# Patient Record
Sex: Female | Born: 1995 | Race: White | Hispanic: No | Marital: Single | Smoking: Former smoker
Health system: Southern US, Community
[De-identification: ages and names within clinical notes are randomized; demographics above are authoritative.]

## PROBLEM LIST (undated history)

## (undated) DIAGNOSIS — B009 Herpesviral infection, unspecified: Secondary | ICD-10-CM

## (undated) DIAGNOSIS — B977 Papillomavirus as the cause of diseases classified elsewhere: Secondary | ICD-10-CM

## (undated) DIAGNOSIS — D682 Hereditary deficiency of other clotting factors: Secondary | ICD-10-CM

## (undated) DIAGNOSIS — Z86718 Personal history of other venous thrombosis and embolism: Secondary | ICD-10-CM

## (undated) HISTORY — PX: ADENOIDECTOMY: SHX5191

## (undated) HISTORY — PX: SINUSOTOMY: SHX291

## (undated) HISTORY — DX: Hereditary deficiency of other clotting factors: D68.2

## (undated) HISTORY — PX: WISDOM TOOTH EXTRACTION: SHX21

## (undated) HISTORY — PX: MYRINGOTOMY: SUR874

---

## 2015-08-11 DIAGNOSIS — Z8619 Personal history of other infectious and parasitic diseases: Secondary | ICD-10-CM | POA: Insufficient documentation

## 2015-08-11 DIAGNOSIS — L680 Hirsutism: Secondary | ICD-10-CM | POA: Insufficient documentation

## 2017-02-01 DIAGNOSIS — R87810 Cervical high risk human papillomavirus (HPV) DNA test positive: Secondary | ICD-10-CM

## 2017-02-01 DIAGNOSIS — R87610 Atypical squamous cells of undetermined significance on cytologic smear of cervix (ASC-US): Secondary | ICD-10-CM | POA: Insufficient documentation

## 2017-07-20 DIAGNOSIS — F319 Bipolar disorder, unspecified: Secondary | ICD-10-CM | POA: Insufficient documentation

## 2017-09-16 DIAGNOSIS — I2699 Other pulmonary embolism without acute cor pulmonale: Secondary | ICD-10-CM | POA: Insufficient documentation

## 2017-10-12 DIAGNOSIS — Z832 Family history of diseases of the blood and blood-forming organs and certain disorders involving the immune mechanism: Secondary | ICD-10-CM | POA: Diagnosis not present

## 2017-10-12 DIAGNOSIS — I2699 Other pulmonary embolism without acute cor pulmonale: Secondary | ICD-10-CM | POA: Diagnosis not present

## 2017-10-12 DIAGNOSIS — Z7901 Long term (current) use of anticoagulants: Secondary | ICD-10-CM | POA: Diagnosis not present

## 2017-12-26 DIAGNOSIS — I2699 Other pulmonary embolism without acute cor pulmonale: Secondary | ICD-10-CM

## 2017-12-26 DIAGNOSIS — Z832 Family history of diseases of the blood and blood-forming organs and certain disorders involving the immune mechanism: Secondary | ICD-10-CM | POA: Diagnosis not present

## 2017-12-26 DIAGNOSIS — D6852 Prothrombin gene mutation: Secondary | ICD-10-CM

## 2017-12-26 DIAGNOSIS — Z7901 Long term (current) use of anticoagulants: Secondary | ICD-10-CM | POA: Diagnosis not present

## 2018-09-20 ENCOUNTER — Encounter (HOSPITAL_COMMUNITY): Payer: Self-pay | Admitting: *Deleted

## 2018-09-20 ENCOUNTER — Other Ambulatory Visit: Payer: Self-pay

## 2018-09-20 ENCOUNTER — Inpatient Hospital Stay (HOSPITAL_COMMUNITY)
Admission: AD | Admit: 2018-09-20 | Discharge: 2018-09-20 | Disposition: A | Discharge status: Home / Self Care | Payer: Medicaid Other | Attending: Family Medicine | Admitting: Family Medicine

## 2018-09-20 ENCOUNTER — Inpatient Hospital Stay (HOSPITAL_BASED_OUTPATIENT_CLINIC_OR_DEPARTMENT_OTHER): Payer: Medicaid Other

## 2018-09-20 DIAGNOSIS — Z6730 Type AB blood, Rh positive: Secondary | ICD-10-CM | POA: Insufficient documentation

## 2018-09-20 DIAGNOSIS — Z87891 Personal history of nicotine dependence: Secondary | ICD-10-CM | POA: Diagnosis not present

## 2018-09-20 DIAGNOSIS — O209 Hemorrhage in early pregnancy, unspecified: Secondary | ICD-10-CM | POA: Insufficient documentation

## 2018-09-20 DIAGNOSIS — Z87898 Personal history of other specified conditions: Secondary | ICD-10-CM | POA: Insufficient documentation

## 2018-09-20 DIAGNOSIS — Z86711 Personal history of pulmonary embolism: Secondary | ICD-10-CM | POA: Insufficient documentation

## 2018-09-20 DIAGNOSIS — F319 Bipolar disorder, unspecified: Secondary | ICD-10-CM | POA: Diagnosis not present

## 2018-09-20 DIAGNOSIS — O4692 Antepartum hemorrhage, unspecified, second trimester: Secondary | ICD-10-CM

## 2018-09-20 DIAGNOSIS — Z3A15 15 weeks gestation of pregnancy: Secondary | ICD-10-CM | POA: Diagnosis not present

## 2018-09-20 DIAGNOSIS — O2692 Pregnancy related conditions, unspecified, second trimester: Secondary | ICD-10-CM | POA: Diagnosis not present

## 2018-09-20 DIAGNOSIS — O99342 Other mental disorders complicating pregnancy, second trimester: Secondary | ICD-10-CM | POA: Diagnosis not present

## 2018-09-20 DIAGNOSIS — F1991 Other psychoactive substance use, unspecified, in remission: Secondary | ICD-10-CM | POA: Insufficient documentation

## 2018-09-20 DIAGNOSIS — Z7901 Long term (current) use of anticoagulants: Secondary | ICD-10-CM | POA: Insufficient documentation

## 2018-09-20 HISTORY — DX: Herpesviral infection, unspecified: B00.9

## 2018-09-20 HISTORY — DX: Personal history of other venous thrombosis and embolism: Z86.718

## 2018-09-20 HISTORY — DX: Papillomavirus as the cause of diseases classified elsewhere: B97.7

## 2018-09-20 LAB — URINALYSIS, ROUTINE W REFLEX MICROSCOPIC
Bacteria, UA: NONE SEEN
Bilirubin Urine: NEGATIVE
Glucose, UA: NEGATIVE mg/dL
Ketones, ur: NEGATIVE mg/dL
Leukocytes,Ua: NEGATIVE
Nitrite: NEGATIVE
Protein, ur: NEGATIVE mg/dL
Specific Gravity, Urine: 1.003 — ABNORMAL LOW (ref 1.005–1.030)
pH: 8 (ref 5.0–8.0)

## 2018-09-20 NOTE — MAU Note (Signed)
PT SAYS LMP WAS 06-07-2018- PREG CONFIRMED  IN [facility] IN [place]- PLANS  TO TRANSFER  TO [facility] [place]. . EDC- 03-14-2019.  TONIGHT  AT 0030- ROLLED OVER TO SIDE -HAD PAIN-  THEN HAD SEX- THEN HAD  BASKETBALL SIZE   OF BLOOD ON BED-    PT IS ON BLOOD THINNERS.   (PT ON PHONE )    PAD ON IN TRIAGE - NOTHING .  FELT CRAMPS AT HOME- - NOW CRAMPS  SAME AS HOME.   Marland Kitchen

## 2018-09-20 NOTE — MAU Provider Note (Signed)
Chief Complaint:  Vaginal Bleeding   First Provider Initiated Contact with Patient 09/20/18 0328      HPI: Shannon Faulkner is a 23 y.o. G2P1001 at 51w0dwho presents to maternity admissions reporting heavy vaginal bleeding after intercourse with mild cramping.  Both of which have now resolved.  . She reports good fetal movement, denies LOF, vaginal bleeding, vaginal itching/burning, urinary symptoms, h/a, dizziness, n/v, diarrhea, constipation or fever/chills.  Blood type AB+  RN Note: PT SAYS LMP WAS 06-07-2018- PREG CONFIRMED  IN [facility] IN [place]- PLANS  TO TRANSFER  TO [facility] [place]. . EDC- 03-14-2019.  TONIGHT  AT 0030- ROLLED OVER TO SIDE -HAD PAIN-  THEN HAD SEX- THEN HAD  BASKETBALL SIZE   OF BLOOD ON BED-    PT IS ON BLOOD THINNERS.   (PT ON PHONE )    PAD ON IN TRIAGE - NOTHING .  FELT CRAMPS AT HOME- - NOW CRAMPS  SAME AS HOME.   Patient Active Problem List   Diagnosis Date Noted  . History of pulmonary embolism 09/20/2018  . History of drug use 09/20/2018  . Pulmonary embolism and infarction (HCC) 09/16/2017  . Bipolar 1 disorder (HCC) 07/20/2017  . ASCUS with positive high risk HPV cervical 02/01/2017  . 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 H/O chlamydia infection 08/11/2015     Past Medical History: Past Medical History:  Diagnosis Date  . H/O blood clots   . HPV in female   . HSV infection     Past obstetric history: OB History  Gravida Para Term Preterm AB Living  2 1 1     1   SAB TAB Ectopic Multiple Live Births          1    # Outcome Date GA Lbr Len/2nd Weight Sex Delivery Anes PTL Lv  2 Current           1 Term      Vag-Spont       Past Surgical History: noncontributory  Family History: History reviewed. No pertinent family history.  Social History: Social History   Tobacco Use  . Smoking status: Former Games developer  . Smokeless tobacco: Former Engineer, water Use Topics  . Alcohol use: Not on file    Comment: NOT WHILE PREG  . Drug use: Never     Allergies: No Known Allergies  Meds:  Medications Prior to Admission  Medication Sig Dispense Refill Last Dose  . enoxaparin (LOVENOX) 30 MG/0.3ML injection Inject 30 mg into the skin every 12 (twelve) hours.   09/19/2018 at Unknown time  . Prenatal Vit-Fe Fumarate-FA (PRENATAL MULTIVITAMIN) TABS tablet Take 1 tablet by mouth daily at 12 noon.   09/19/2018 at Unknown time    I have reviewed patient's Past Medical Hx, Surgical Hx, Family Hx, Social Hx, medications and allergies.   ROS:  Review of Systems  Constitutional: Negative for chills and fever.  Gastrointestinal: Negative for constipation, diarrhea and nausea.  Genitourinary: Positive for pelvic pain (some cramping, now resolved) and vaginal bleeding.  Neurological: Negative for weakness.   Other systems negative  Physical Exam   Patient Vitals for the past 24 hrs:  BP Temp Temp src Pulse Resp Height Weight  09/20/18 0245 106/69 98.2 F (36.8 C) Oral (!) 104 20 5\' 2"  (1.575 m) 62.8 kg   Constitutional: Well-developed, well-nourished female in no acute distress.  Cardiovascular: normal rate and rhythm Respiratory: normal effort, clear to auscultation bilaterally GI: Abd soft, non-tender, gravid appropriate for gestational age.  No rebound or guarding. MS: Extremities nontender, no edema, normal ROM Neurologic: Alert and oriented x 4.  GU: Neg CVAT.  PELVIC EXAM: Cervix pink, visually closed, without lesion, scant red mucousy discharge, vaginal walls and external genitalia normal  Cervix closed and long  FHT:  155   Labs: Results for orders placed or performed during the hospital encounter of 09/20/18 (from the past 24 hour(s))  Urinalysis, Routine w reflex microscopic     Status: Abnormal   Collection Time: 09/20/18  3:14 AM  Result Value Ref Range   Color, Urine COLORLESS (A) YELLOW   APPearance CLEAR CLEAR   Specific Gravity, Urine 1.003 (L) 1.005 - 1.030   pH 8.0 5.0 - 8.0   Glucose, UA NEGATIVE NEGATIVE  mg/dL   Hgb urine dipstick SMALL (A) NEGATIVE   Bilirubin Urine NEGATIVE NEGATIVE   Ketones, ur NEGATIVE NEGATIVE mg/dL   Protein, ur NEGATIVE NEGATIVE mg/dL   Nitrite NEGATIVE NEGATIVE   Leukocytes,Ua NEGATIVE NEGATIVE   RBC / HPF 0-5 0 - 5 RBC/hpf   WBC, UA 0-5 0 - 5 WBC/hpf   Bacteria, UA NONE SEEN NONE SEEN   Squamous Epithelial / LPF 0-5 0 - 5     Blood Type AB+ from prenatals  Imaging:    MAU Course/MDM: I have ordered labs and reviewed results. Urine clear, Blood type AB+ US ordered to rule out previa, none seen (per US tech)  Prev [facility] no longer seen Discussed post coital bleeding could be related to friable cervix [facility] Recommend pelvic rest until 1-2 weeks with no bleeding    Assessment: 1. Antepartum bleeding, second trimester   2.     On Lovenox for hx PE/Infarct 3.     Blood type AB+  Plan: Discharge home Bleeding precautions  Follow up in Office for prenatal visits and recheck of status Pelvic rest Encouraged to return here or to other Urgent Care/ED if she develops worsening of symptoms, increase in pain, fever, or other concerning symptoms.  Pt stable at time of discharge.  Wynelle BourgeoisMarie Hayle Parisi CNM, MSN Certified Nurse-Midwife 09/20/2018 3:28 AM

## 2018-09-20 NOTE — Discharge Instructions (Signed)
Activity Restriction During Pregnancy °Your health care provider may recommend specific activity restrictions during pregnancy for a variety of reasons. Activity restriction may require that you limit activities that require great effort, such as exercise, lifting, or sex. °The type of activity restriction will vary for each person, depending on your risk or the problems you are having. Activity restriction may be recommended for a period of time until your baby is delivered. °Why are activity restrictions recommended? °Activity restriction may be recommended if: °· Your placenta is partially or completely covering the opening of your cervix (placenta previa). °· There is bleeding between the wall of the uterus and the amniotic sac in the first trimester of pregnancy (subchorionic hemorrhage). °· You went into labor too early (preterm labor). °· You have a history of miscarriage. °· You have a condition that causes high blood pressure during pregnancy (preeclampsia or eclampsia). °· You are pregnant with more than one baby. °· Your baby is not growing well. °What are the risks? °The risks depend on your specific restriction. Strict bed rest has the most physical and emotional risks and is no longer routinely recommended. Risks of strict bed rest include: °· Loss of muscle conditioning from not moving. °· Blood clots. °· Social isolation. °· Depression. °· Loss of income. °Talk with your health care team about activity restriction to decide if it is best for you and your baby. Even if you are having problems during your pregnancy, you may be able to continue with normal levels of activity with careful monitoring by your health care team. °Follow these instructions at home: °If needed, based on your overall health and the health of your baby, your health care provider will decide which type of activity restriction is right for you. Activity restrictions may include: °· Not lifting anything heavier than 10 pounds (4.5  kg). °· Avoiding activities that take a lot of physical effort. °· No lifting or straining. °· Resting in a sitting position or lying down for periods of time during the day. °Pelvic rest may be recommended along with activity restrictions. If pelvic rest is recommended, then: °· Do not have sex, an orgasm, or use sexual stimulation. °· Do not use tampons. Do not douche. Do not put anything into your vagina. °· Do not lift anything that is heavier than 10 lb (4.5 kg). °· Avoid activities that require a lot of effort. °· Avoid any activity in which your pelvic muscles could become strained, such as squatting. °Questions to ask your health care provider °· Why is my activity being limited? °· How will activity restrictions affect my body? °· Why is rest helpful for me and my baby? °· What activities can I do? °· When can I return to normal activities? °When should I seek immediate medical care? °Seek immediate medical care if you have: °· Vaginal bleeding. °· Vaginal discharge. °· Cramping pain in your lower abdomen. °· Regular contractions. °· A low, dull backache. °Summary °· Your health care provider may recommend specific activity restrictions during pregnancy for a variety of reasons. °· Activity restriction may require that you limit activities such as exercise, lifting, sex, or any other activity that requires great effort. °· Discuss the risks and benefits of activity restriction with your health care team to decide if it is best for you and your baby. °· Contact your health care provider right away if you think you are having contractions, or if you notice vaginal bleeding, discharge, or cramping. °This information is not   intended to replace advice given to you by your health care provider. Make sure you discuss any questions you have with your health care provider. Document Released: 09/11/2010 Document Revised: 09/06/2017 Document Reviewed: 09/06/2017 Elsevier Interactive Patient Education  2019 Elsevier  Inc. Vaginal Bleeding During Pregnancy, Second Trimester  A small amount of bleeding (spotting) from the vagina is relatively common during pregnancy. It usually stops on its own. Various things can cause spotting during pregnancy. Sometimes the bleeding is normal and is not a sign of a problem in the pregnancy. However, bleeding can also be a sign of something serious. Be sure to tell your health care provider about any vaginal bleeding right away. Some possible causes of vaginal bleeding during the second trimester include:  Infection, inflammation, or growths (polyps) on the cervix.  A condition in which the placenta partially or completely covers the opening of the cervix inside the uterus (placenta previa).  The placenta separating from the uterus (placenta abruption).  Early (preterm) labor.  The cervix opening and thinning before pregnancy is at term and before labor starts (cervical insufficiency).  A mass of tissue developing in the uterus due to an egg being fertilized incorrectly (molar pregnancy). Follow these instructions at home: Activity  Follow instructions from your health care provider about limiting your activity. Ask what activities are safe for you.  If needed, make plans for someone to help with your regular activities.  Do not exercise or do activities that take a lot of effort unless your health care provider approves.  Do not lift anything that is heavier than 10 lb (4.5 kg), or the limit that your health care provider tells you, until he or she says that it is safe.  Do not have sex or orgasms until your health care provider says that this is safe. Medicines  Take over-the-counter and prescription medicines only as told by your health care provider.  Do not take aspirin because it can cause bleeding. General instructions  Pay attention to any changes in your symptoms.  Write down how many pads you use each day, how often you change pads, and how soaked  (saturated) they are.  Do not use tampons or douche.  If you pass any tissue from your vagina, save the tissue so you can show it to your health care provider.  Keep all follow-up visits as told by your health care provider. This is important. Contact a health care provider if:  You have vaginal bleeding during any time of your pregnancy.  You have cramps or labor pains.  You have a fever that does not get better when you take medicines. Get help right away if:  You have severe cramps in your back or abdomen.  You have contractions.  You have chills.  You pass large clots or a large amount of tissue from your vagina.  Your bleeding increases.  You feel light-headed or weak, or you faint.  You are leaking fluid or have a gush of fluid from your vagina. Summary  Various things can cause bleeding or spotting in pregnancy.  Be sure to tell your health care provider about any vaginal bleeding right away.  Follow instructions from your health care provider about limiting your activity. Ask what activities are safe for you. This information is not intended to replace advice given to you by your health care provider. Make sure you discuss any questions you have with your health care provider. Document Released: 02/24/2005 Document Revised: 08/19/2016 Document Reviewed: 08/19/2016 Elsevier  Interactive Patient Education  2019 Elsevier Inc.  [facility] Providers    [facility] [employer] [facility] at [facility]       Phone: [phone]  [facility] for [employer] at [place]   Phone: [phone]  [facility] [employer] at [place]  Phone: [phone]  [facility] for [facility] at [facility]  Phone: [phone]  [facility] for [facility] at [place]  Phone: [phone]  [facility] at [facility]   Phone: [phone]  [facility]       Phone: [phone]  [facility]       Phone: [phone]   [facility]    Phone: [phone]  [facility]    Phone: [phone]  [facility]    Phone: [phone]  [facility]       Phone: [phone]   [facility]  Phone: [phone]  [facility]    Phone: [phone]  [facility]   Phone: [phone]  [facility]      Phone: [phone]  [facility]    Phone: [phone]

## 2020-05-25 ENCOUNTER — Other Ambulatory Visit: Payer: Self-pay

## 2020-05-25 ENCOUNTER — Ambulatory Visit (HOSPITAL_COMMUNITY)
Admission: EM | Admit: 2020-05-25 | Discharge: 2020-05-26 | Disposition: A | Discharge status: Home / Self Care | Payer: Medicaid Other | Attending: Behavioral Health | Admitting: Behavioral Health

## 2020-05-25 ENCOUNTER — Ambulatory Visit (HOSPITAL_COMMUNITY)
Admission: AD | Admit: 2020-05-25 | Discharge: 2020-05-25 | Disposition: A | Discharge status: Home / Self Care | Payer: Medicaid Other | Attending: Psychiatry | Admitting: Psychiatry

## 2020-05-25 ENCOUNTER — Encounter (HOSPITAL_COMMUNITY): Payer: Self-pay | Admitting: Emergency Medicine

## 2020-05-25 DIAGNOSIS — F909 Attention-deficit hyperactivity disorder, unspecified type: Secondary | ICD-10-CM | POA: Insufficient documentation

## 2020-05-25 DIAGNOSIS — R4587 Impulsiveness: Secondary | ICD-10-CM | POA: Insufficient documentation

## 2020-05-25 DIAGNOSIS — R454 Irritability and anger: Secondary | ICD-10-CM | POA: Insufficient documentation

## 2020-05-25 DIAGNOSIS — G4700 Insomnia, unspecified: Secondary | ICD-10-CM | POA: Insufficient documentation

## 2020-05-25 DIAGNOSIS — F419 Anxiety disorder, unspecified: Secondary | ICD-10-CM | POA: Insufficient documentation

## 2020-05-25 DIAGNOSIS — Z20822 Contact with and (suspected) exposure to covid-19: Secondary | ICD-10-CM | POA: Insufficient documentation

## 2020-05-25 DIAGNOSIS — R450 Nervousness: Secondary | ICD-10-CM | POA: Insufficient documentation

## 2020-05-25 DIAGNOSIS — Z87891 Personal history of nicotine dependence: Secondary | ICD-10-CM | POA: Insufficient documentation

## 2020-05-25 DIAGNOSIS — Z79899 Other long term (current) drug therapy: Secondary | ICD-10-CM | POA: Insufficient documentation

## 2020-05-25 DIAGNOSIS — F332 Major depressive disorder, recurrent severe without psychotic features: Secondary | ICD-10-CM | POA: Insufficient documentation

## 2020-05-25 DIAGNOSIS — F6381 Intermittent explosive disorder: Secondary | ICD-10-CM

## 2020-05-25 LAB — POCT URINE DRUG SCREEN - MANUAL ENTRY (I-SCREEN)
POC Amphetamine UR: POSITIVE — AB
POC Cocaine UR: NOT DETECTED
POC Methadone UR: NOT DETECTED
POC Methamphetamine UR: NOT DETECTED
POC Morphine: NOT DETECTED
POC Oxazepam (BZO): NOT DETECTED
POC Oxycodone UR: NOT DETECTED
POC Secobarbital (BAR): NOT DETECTED

## 2020-05-25 LAB — POCT URINE DRUG SCREEN - MANUAL ENTRY (I-CUP)
POC Buprenorphine (BUP): NOT DETECTED
POC Marijuana UR: NOT DETECTED

## 2020-05-25 LAB — GLUCOSE, CAPILLARY: Glucose-Capillary: 87 mg/dL (ref 70–99)

## 2020-05-25 LAB — POC SARS CORONAVIRUS 2 AG -  ED: SARS Coronavirus 2 Ag: NEGATIVE

## 2020-05-25 LAB — POCT PREGNANCY, URINE: Preg Test, Ur: NEGATIVE

## 2020-05-25 LAB — POC SARS CORONAVIRUS 2 AG: SARS Coronavirus 2 Ag: NEGATIVE

## 2020-05-25 LAB — CBG MONITORING, ED

## 2020-05-25 MED ORDER — BUPROPION HCL ER (XL) 150 MG PO TB24
150.0000 mg | ORAL_TABLET | Freq: Two times a day (BID) | ORAL | Status: DC
  Administered 2020-05-25: 150 mg via ORAL
  Filled 2020-05-25: qty 1

## 2020-05-25 MED ORDER — MAGNESIUM HYDROXIDE 400 MG/5ML PO SUSP: 30.0000 mL | Freq: Every day | ORAL | Status: DC | PRN

## 2020-05-25 MED ORDER — HYDROXYZINE HCL 25 MG PO TABS
25.0000 mg | ORAL_TABLET | Freq: Once | ORAL | Status: AC
  Administered 2020-05-25: 25 mg via ORAL
  Filled 2020-05-25: qty 1

## 2020-05-25 MED ORDER — ACETAMINOPHEN 325 MG PO TABS: 650.0000 mg | ORAL_TABLET | Freq: Four times a day (QID) | ORAL | Status: DC | PRN

## 2020-05-25 MED ORDER — ALUM & MAG HYDROXIDE-SIMETH 200-200-20 MG/5ML PO SUSP: 30.0000 mL | ORAL | Status: DC | PRN

## 2020-05-25 NOTE — ED Notes (Signed)
Pt is sitting in bed eating@this  time. breathing even and unlabored. Will continue to monitor for safety
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
## 2020-05-25 NOTE — ED Notes (Signed)
Patient belongings placed in [place] [id number].

## 2020-05-25 NOTE — ED Triage Notes (Addendum)
Presents with complaint of anger issues at home.  Denies SI, HI or AVH.  Feeling hopeless at present. Complaint of depression and anger outbursts at home.
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
## 2020-05-26 LAB — LIPID PANEL
Cholesterol: 165 mg/dL (ref 0–200)
HDL: 66 mg/dL (ref >40)
LDL Cholesterol: 83 mg/dL (ref 0–99)
Total CHOL/HDL Ratio: 2.5 RATIO
Triglycerides: 81 mg/dL (ref <150)
VLDL: 16 mg/dL (ref 0–40)

## 2020-05-26 LAB — CBC WITH DIFFERENTIAL/PLATELET
Abs Immature Granulocytes: 0.02 10*3/uL (ref 0.00–0.07)
Basophils Absolute: 0.1 10*3/uL (ref 0.0–0.1)
Basophils Relative: 1 %
Eosinophils Absolute: 0.3 10*3/uL (ref 0.0–0.5)
Eosinophils Relative: 4 %
HCT: 39.6 % (ref 36.0–46.0)
Hemoglobin: 11.8 g/dL — ABNORMAL LOW (ref 12.0–15.0)
Immature Granulocytes: 0 %
Lymphocytes Relative: 28 %
Lymphs Abs: 2.6 10*3/uL (ref 0.7–4.0)
MCH: 23.2 pg — ABNORMAL LOW (ref 26.0–34.0)
MCHC: 29.8 g/dL — ABNORMAL LOW (ref 30.0–36.0)
MCV: 78 fL — ABNORMAL LOW (ref 80.0–100.0)
Monocytes Absolute: 0.4 10*3/uL (ref 0.1–1.0)
Monocytes Relative: 5 %
Neutro Abs: 6 10*3/uL (ref 1.7–7.7)
Neutrophils Relative %: 62 %
Platelets: 373 10*3/uL (ref 150–400)
RBC: 5.08 MIL/uL (ref 3.87–5.11)
RDW: 19.3 % — ABNORMAL HIGH (ref 11.5–15.5)
WBC: 9.5 10*3/uL (ref 4.0–10.5)
nRBC: 0 % (ref 0.0–0.2)

## 2020-05-26 LAB — COMPREHENSIVE METABOLIC PANEL
ALT: 17 U/L (ref 0–44)
AST: 17 U/L (ref 15–41)
Albumin: 4.3 g/dL (ref 3.5–5.0)
Alkaline Phosphatase: 87 U/L (ref 38–126)
Anion gap: 13 (ref 5–15)
BUN: 7 mg/dL (ref 6–20)
CO2: 24 mmol/L (ref 22–32)
Calcium: 9.4 mg/dL (ref 8.9–10.3)
Chloride: 102 mmol/L (ref 98–111)
Creatinine, Ser: 0.73 mg/dL (ref 0.44–1.00)
GFR, Estimated: >60 mL/min (ref >60)
Glucose, Bld: 79 mg/dL (ref 70–99)
Potassium: 3.4 mmol/L — ABNORMAL LOW (ref 3.5–5.1)
Sodium: 139 mmol/L (ref 135–145)
Total Bilirubin: 0.4 mg/dL (ref 0.3–1.2)
Total Protein: 7.5 g/dL (ref 6.5–8.1)

## 2020-05-26 LAB — RESP PANEL BY RT-PCR (FLU A&B, COVID) ARPGX2
Influenza A by PCR: NEGATIVE
Influenza B by PCR: NEGATIVE
SARS Coronavirus 2 by RT PCR: NEGATIVE

## 2020-05-26 LAB — HEMOGLOBIN A1C
Hgb A1c MFr Bld: 5.2 % (ref 4.8–5.6)
Mean Plasma Glucose: 102.54 mg/dL

## 2020-05-26 LAB — TSH: TSH: 1.112 u[IU]/mL (ref 0.350–4.500)

## 2020-05-26 LAB — ETHANOL: Alcohol, Ethyl (B): <10 mg/dL (ref <10)

## 2020-05-26 LAB — MAGNESIUM: Magnesium: 2 mg/dL (ref 1.7–2.4)

## 2020-05-26 MED ORDER — BUPROPION HCL ER (XL) 300 MG PO TB24: 300.0000 mg | ORAL_TABLET | Freq: Every day | ORAL | 0 refills | Status: AC

## 2020-05-26 MED ORDER — OXCARBAZEPINE 150 MG PO TABS: 150.0000 mg | ORAL_TABLET | Freq: Two times a day (BID) | ORAL | 0 refills | Status: AC

## 2020-05-26 MED ORDER — BUPROPION HCL ER (XL) 300 MG PO TB24
300.0000 mg | ORAL_TABLET | Freq: Every day | ORAL | Status: DC
  Administered 2020-05-26: 09:00:00 300 mg via ORAL
  Filled 2020-05-26: qty 1

## 2020-05-26 MED ORDER — OXCARBAZEPINE 150 MG PO TABS
150.0000 mg | ORAL_TABLET | Freq: Two times a day (BID) | ORAL | Status: DC
  Administered 2020-05-26: 09:00:00 150 mg via ORAL
  Filled 2020-05-26: qty 1

## 2020-05-26 NOTE — ED Notes (Signed)
Pt sleeping at present, no distress noted, monitoring for safety. 

## 2020-05-26 NOTE — ED Notes (Signed)
Patient denies SI/HI and A/V/H. Patient is sitting on bed with eyes opened. Patient voices no concern. Monitoring continues.

## 2020-05-26 NOTE — ED Notes (Signed)
Patient denies SI/HI. Patient is alert and oriented. Patient provided support and encouragement. Monitoring continues.
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
## 2020-05-26 NOTE — Discharge Instructions (Addendum)

## 2020-05-26 NOTE — ED Notes (Signed)
Pt d/c w/[facility] to transfer Pt to [facility] to pick up her personal vehicle. Personal belongings returned prior to d/c from facility. Pt alert, orient and ambulatory. Safety maintained.

## 2020-05-26 NOTE — ED Notes (Signed)
Discharge instructions provided. Pt stated understanding. Safe transport called for services to [facility] to pick up her car. Safety maintained.

## 2020-05-27 LAB — PROLACTIN: Prolactin: 8.3 ng/mL (ref 4.8–23.3)

## 2020-07-24 ENCOUNTER — Telehealth: Payer: Self-pay | Admitting: Oncology

## 2020-07-24 NOTE — Telephone Encounter (Signed)
Patient referred by Harlen Labs, NP for New Dx: Heterozygous Factor II Def.  Appt made for 07/31/20  Consult at 1:45 pm

## 2020-07-30 NOTE — Progress Notes (Signed)
Shannon Faulkner,  Shannon  [id number] [phone]  Clinic Day:  07/31/2020  Referring physician: Harlen Labs, Shannon  HISTORY OF PRESENT ILLNESS:  The patient is a 25 y.o. female  who I was asked to re-evaluate for being heterozygous for the prothrombin gene muation.  Faulkner was discovered after having a DVT in the postpartum setting with her 1st pregnancy.  However, she did not have any clotting complications with her 2nd and 3rd pregnancies as she was placed on Lovenox.  She has been off Lovenox since December 2021; her 3rd child was born October 2021.  Overall, the patient has been doing well.  Se denies having leg swelling, pleuritic chest pain or other symptoms which concern her for either a DVT or pulmonary embolus having re-developed.    PHYSICAL EXAM:  Blood pressure 129/66, pulse 99, temperature 98.6 F (37 C), resp. rate 16, height 5\' 2"  (1.575 m), weight 148 lb (67.1 kg), SpO2 96 %, unknown if currently breastfeeding. Wt Readings from Last 3 Encounters:  07/31/20 148 lb (67.1 kg)  09/20/18 138 lb 6 oz (62.8 kg)   Body mass index is 27.07 kg/m. Performance status (ECOG): 0 - Asymptomatic Physical Exam Constitutional:      Appearance: Normal appearance. She is not ill-appearing.  HENT:     Mouth/Throat:     Mouth: Mucous membranes are moist.     Pharynx: Oropharynx is clear. No oropharyngeal exudate or posterior oropharyngeal erythema.  Cardiovascular:     Rate and Rhythm: Normal rate and regular rhythm.     Heart sounds: No murmur heard. No friction rub. No gallop.   Pulmonary:     Effort: Pulmonary effort is normal. No respiratory distress.     Breath sounds: Normal breath sounds. No wheezing, rhonchi or rales.  Chest:  Breasts:     Right: No axillary adenopathy or supraclavicular adenopathy.     Left: No axillary adenopathy or supraclavicular adenopathy.    Abdominal:     General: Bowel sounds are normal.  There is no distension.     Palpations: Abdomen is soft. There is no mass.     Tenderness: There is no abdominal tenderness.  Musculoskeletal:        General: No swelling.     Right lower leg: No edema.     Left lower leg: No edema.  Lymphadenopathy:     Cervical: No cervical adenopathy.     Upper Body:     Right upper body: No supraclavicular or axillary adenopathy.     Left upper body: No supraclavicular or axillary adenopathy.     Lower Body: No right inguinal adenopathy. No left inguinal adenopathy.  Skin:    General: Skin is warm.     Coloration: Skin is not jaundiced.     Findings: No lesion or rash.  Neurological:     General: No focal deficit present.     Mental Status: She is alert and oriented to person, place, and time. Mental status is at baseline.     Cranial Nerves: Cranial nerves are intact.  Psychiatric:        Mood and Affect: Mood normal.        Behavior: Behavior normal.        Thought Content: Thought content normal.     Faulkner:   CBC Latest Ref Rng & Units 05/25/2020  WBC 4.0 - 10.5 K/uL 9.5  Hemoglobin 12.0 - 15.0 g/dL 11.8(L)  Hematocrit 36.0 -  46.0 % 39.6  Platelets 150 - 400 K/uL 373   CMP Latest Ref Rng & Units 05/25/2020  Glucose 70 - 99 mg/dL 79  BUN 6 - 20 mg/dL 7  Creatinine 4.40 - 3.47 mg/dL 4.25  Sodium 956 - 387 mmol/L 139  Potassium 3.5 - 5.1 mmol/L 3.4(L)  Chloride 98 - 111 mmol/L 102  CO2 22 - 32 mmol/L 24  Calcium 8.9 - 10.3 mg/dL 9.4  Total Protein 6.5 - 8.1 g/dL 7.5  Total Bilirubin 0.3 - 1.2 mg/dL 0.4  Alkaline Phos 38 - 126 U/L 87  AST 15 - 41 U/L 17  ALT 0 - 44 U/L 17    ASSESSMENT & PLAN:  Assessment/Plan:  A 25 y.o. 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  As mentioned previously, the only time she has had a previous DVT was in the postpartum period after the birth of her 1st child.  Her other 2 pregnancies went fine as she was on Lovenox throughout both of them.  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  However, I would recommend Lovenox if she chooses to become pregnant again, as it has been successful in preventing additional clots with her 2nd and 3rd pregnancies.  I would only consider lifelong anticoagulation if she was to develop another blood clot, particularly if it was unprovoked in nature.  As she has no other pressing hematologic issues, I will turn her care back over to her other physicians.  I have no problem seeing Faulkner patient in the future if new hematologic issues develop that require repeat clinical assessment. The patient understands all the plans discussed today and is in agreement with them.     Antonios Ostrow Kirby Funk, MD

## 2020-07-31 ENCOUNTER — Other Ambulatory Visit: Payer: Self-pay

## 2020-07-31 ENCOUNTER — Inpatient Hospital Stay: Payer: Medicaid Other | Attending: Oncology | Admitting: Oncology

## 2020-07-31 ENCOUNTER — Encounter: Payer: Self-pay | Admitting: Oncology

## 2020-07-31 DIAGNOSIS — D6852 Prothrombin gene mutation: Secondary | ICD-10-CM | POA: Diagnosis not present

## 2020-08-10 DIAGNOSIS — D6852 Prothrombin gene mutation: Secondary | ICD-10-CM | POA: Insufficient documentation

## 2020-09-09 ENCOUNTER — Telehealth: Payer: Self-pay | Admitting: Genetic Counselor

## 2020-09-09 NOTE — Telephone Encounter (Signed)
Called Shannon Faulkner to inform her that she meets genetic testing criteria based on information filled out in her [facility] from [facility].  Interested in genetic counseling.  In office telehealth appointment set up for 4/19 at 2pm.

## 2020-09-16 ENCOUNTER — Ambulatory Visit: Payer: Self-pay | Admitting: Genetic Counselor

## 2020-09-16 NOTE — Progress Notes (Signed)
Error

## 2020-12-17 IMAGING — US US MFM OB LIMITED
1 series · 15 of 24 positions shown · non-contrast
Comparison: none

[Series 1: us mfm ob limited · 15 of 24 slices shown]
[im 1/24]
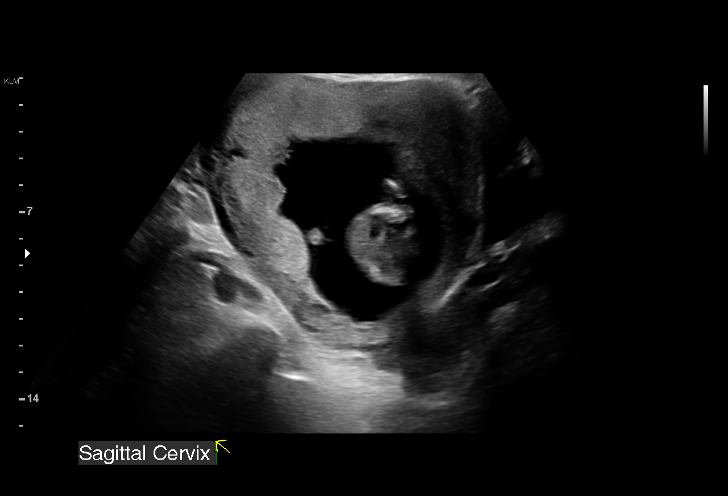
[im 3/24]
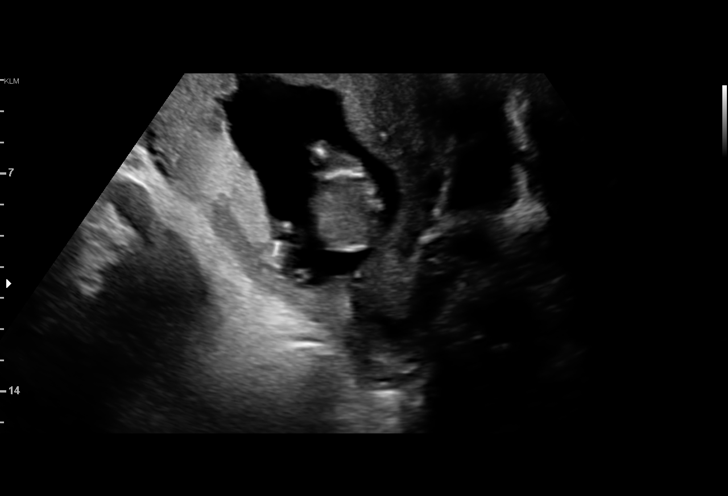
[im 5/24]
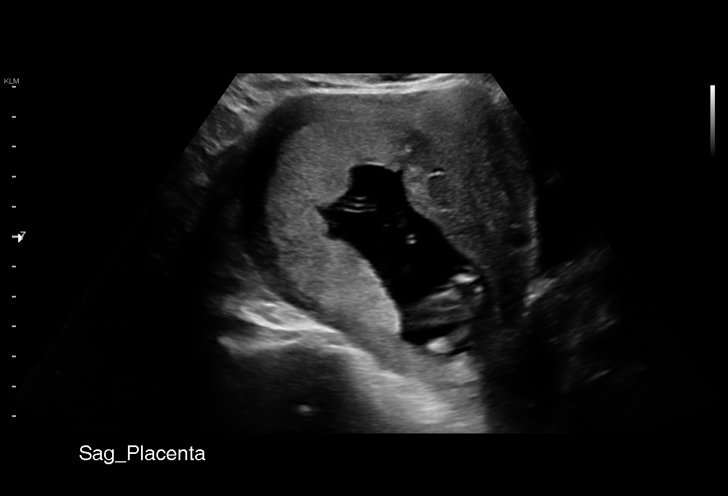
[im 6/24]
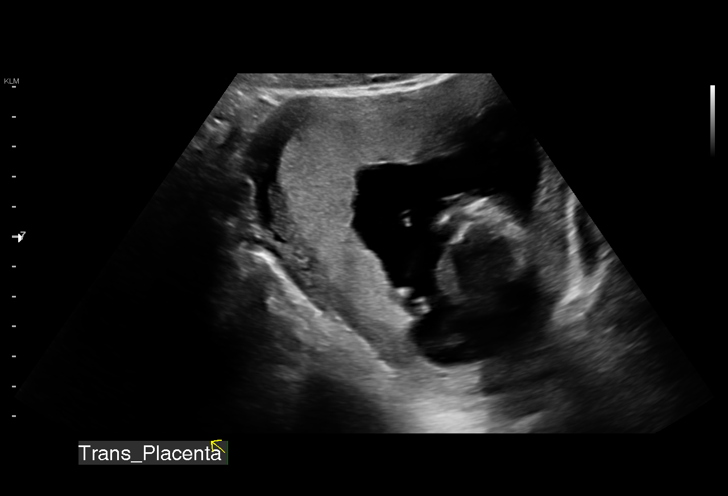
[im 8/24]
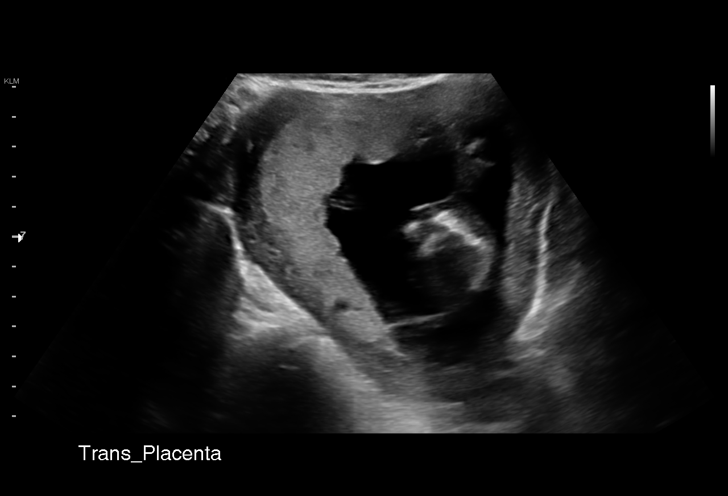
[im 9/24]
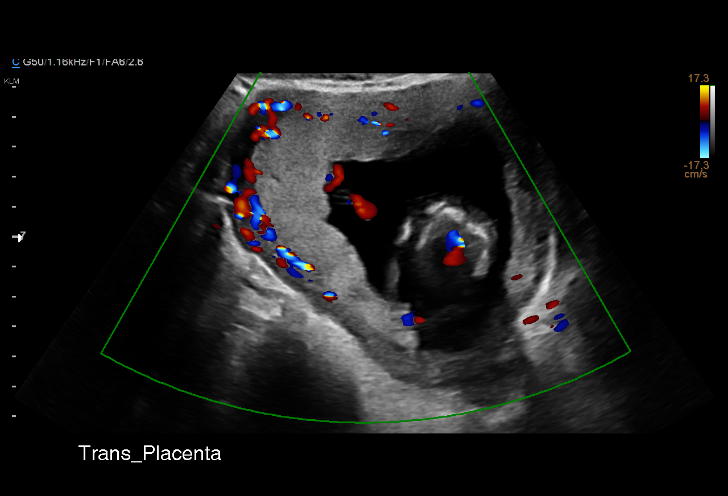
[im 11/24]
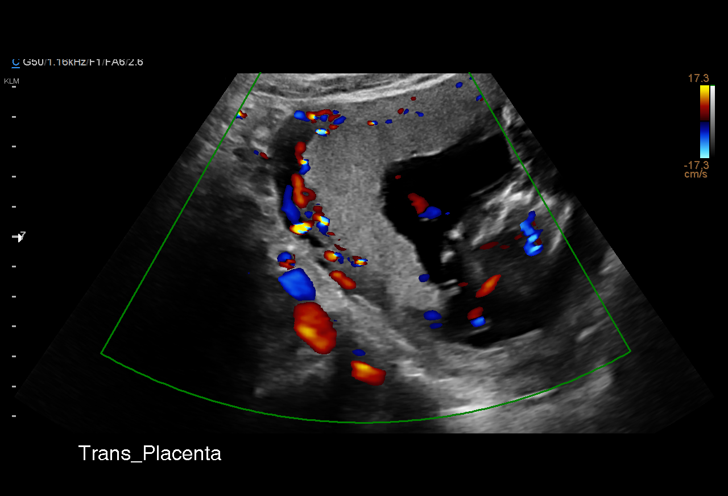
[im 13/24]
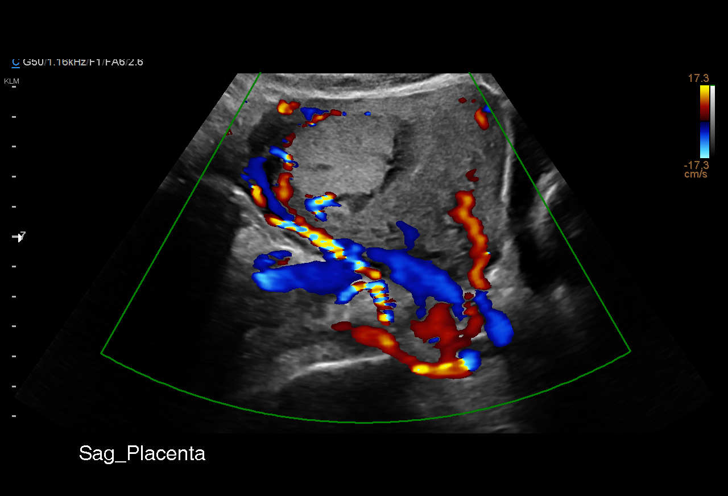
[im 14/24]
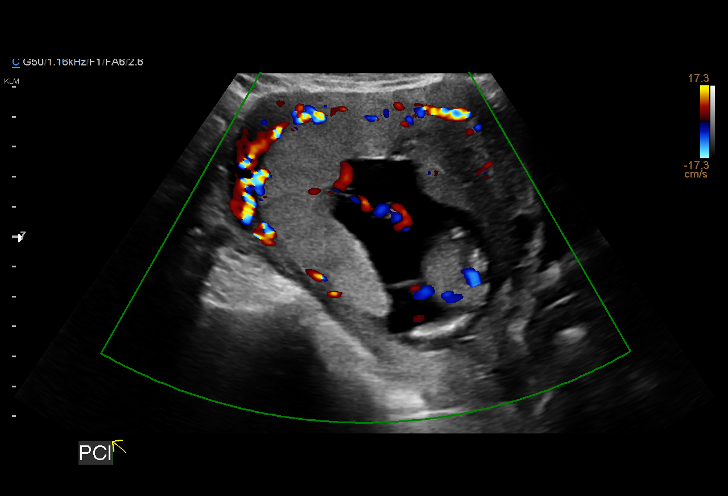
[im 16/24]
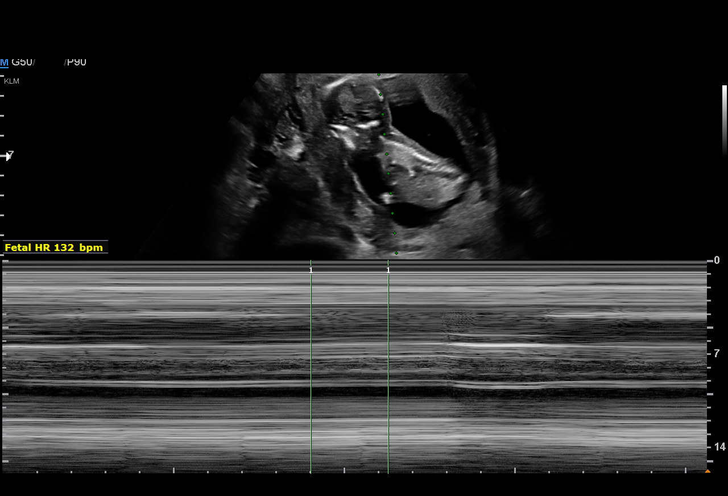
[im 17/24]
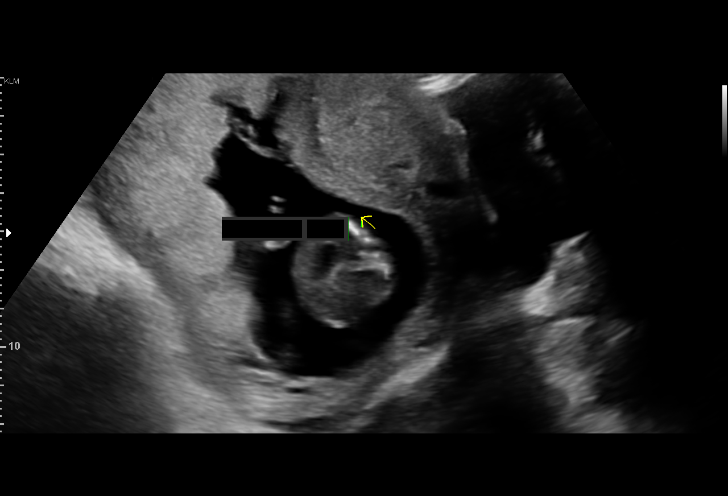
[im 19/24]
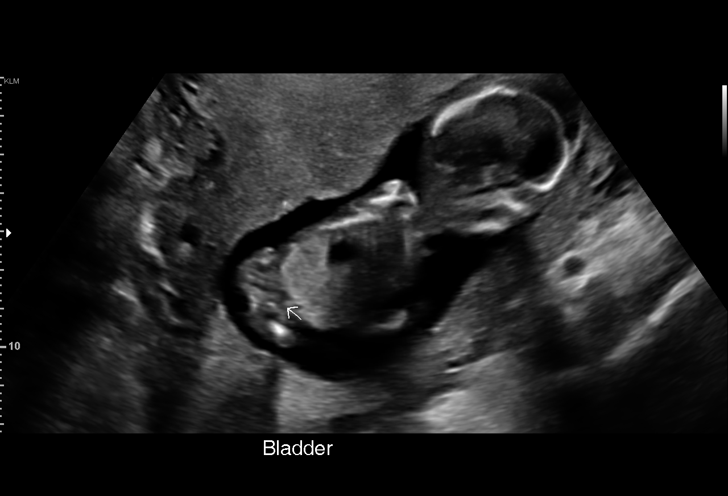
[im 21/24]
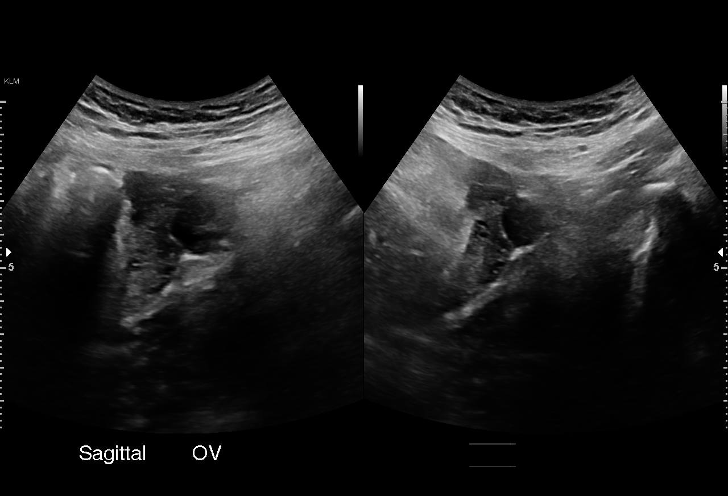
[im 22/24]
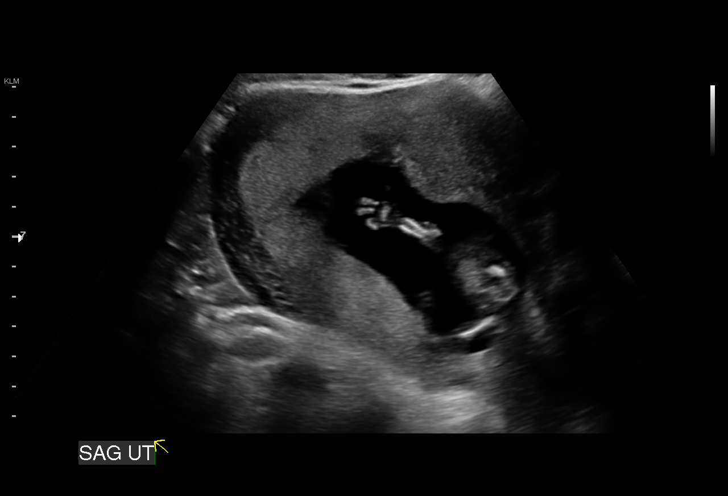
[im 24/24]
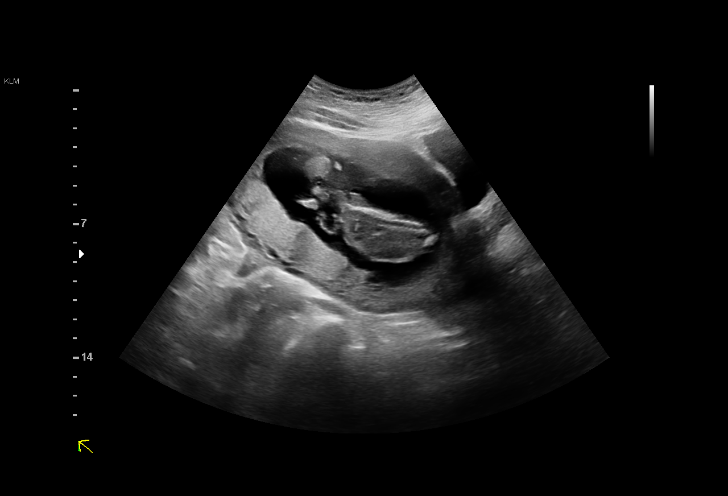

[15 of 24 positions shown; findings below may reference images not displayed]

[REDACTED]
                   KAZARIDBA HARED CNM

 ----------------------------------------------------------------------

 ----------------------------------------------------------------------
Indications

  Vaginal bleeding in pregnancy, second
  trimester (heavy bleeding after intercourse)
  15 weeks gestation of pregnancy
  Medical complication of pregnancy (on
  lovenox, history of PE )
 ----------------------------------------------------------------------
Fetal Evaluation

 Num Of Fetuses:         1
 Fetal Heart Rate(bpm):  132
 Cardiac Activity:       Observed
 Presentation:           Breech
 Placenta:               Posterior
 P. Cord Insertion:      Visualized

 Amniotic Fluid
 AFI FV:      Within normal limits
OB History

 Gravidity:    2         Term:   1        Prem:   0        SAB:   0
 TOP:          0       Ectopic:  0        Living: 1
Gestational Age

 LMP:           15w 0d        Date:  06/07/18                 EDD:   03/14/19
 Best:          15w 0d     Det. By:  LMP  (06/07/18)          EDD:   03/14/19
Anatomy
 Stomach:               Appears normal, left   Bladder:                Appears normal
                        sided
Cervix Uterus Adnexa

 Cervix
 Closed.

 Uterus
 No abnormality visualized.

 Left Ovary
 Within normal limits.

 Right Ovary
 Within normal limits.

 Cul De Sac
 No free fluid seen.

 Adnexa
 No abnormality visualized.
Impression

 Limited exam
 Cervix length appropriate
 No evidence of bleeding source.
Recommendations

 Follow up anatomy in 4 weeks.

## 2021-10-08 ENCOUNTER — Other Ambulatory Visit: Payer: Self-pay

## 2021-10-08 ENCOUNTER — Emergency Department (HOSPITAL_BASED_OUTPATIENT_CLINIC_OR_DEPARTMENT_OTHER): Payer: Medicaid Other

## 2021-10-08 ENCOUNTER — Emergency Department (HOSPITAL_COMMUNITY)
Admission: EM | Admit: 2021-10-08 | Discharge: 2021-10-08 | Disposition: A | Discharge status: Home / Self Care | Payer: Medicaid Other | Attending: Emergency Medicine | Admitting: Emergency Medicine

## 2021-10-08 ENCOUNTER — Encounter (HOSPITAL_COMMUNITY): Payer: Self-pay | Admitting: *Deleted

## 2021-10-08 DIAGNOSIS — M79605 Pain in left leg: Secondary | ICD-10-CM | POA: Insufficient documentation

## 2021-10-08 DIAGNOSIS — R202 Paresthesia of skin: Secondary | ICD-10-CM | POA: Insufficient documentation

## 2021-10-08 DIAGNOSIS — D72829 Elevated white blood cell count, unspecified: Secondary | ICD-10-CM | POA: Insufficient documentation

## 2021-10-08 DIAGNOSIS — R0602 Shortness of breath: Secondary | ICD-10-CM | POA: Insufficient documentation

## 2021-10-08 DIAGNOSIS — M7989 Other specified soft tissue disorders: Secondary | ICD-10-CM

## 2021-10-08 DIAGNOSIS — R609 Edema, unspecified: Secondary | ICD-10-CM | POA: Diagnosis not present

## 2021-10-08 LAB — COMPREHENSIVE METABOLIC PANEL
ALT: 8 U/L (ref 0–44)
AST: 24 U/L (ref 15–41)
Albumin: 4.2 g/dL (ref 3.5–5.0)
Alkaline Phosphatase: 55 U/L (ref 38–126)
Anion gap: 9 (ref 5–15)
BUN: 9 mg/dL (ref 6–20)
CO2: 23 mmol/L (ref 22–32)
Calcium: 9.2 mg/dL (ref 8.9–10.3)
Chloride: 106 mmol/L (ref 98–111)
Creatinine, Ser: 0.74 mg/dL (ref 0.44–1.00)
GFR, Estimated: >60 mL/min (ref >60)
Glucose, Bld: 89 mg/dL (ref 70–99)
Potassium: 3.8 mmol/L (ref 3.5–5.1)
Sodium: 138 mmol/L (ref 135–145)
Total Bilirubin: 0.7 mg/dL (ref 0.3–1.2)
Total Protein: 7.1 g/dL (ref 6.5–8.1)

## 2021-10-08 LAB — CBC WITH DIFFERENTIAL/PLATELET
Abs Immature Granulocytes: 0.03 10*3/uL (ref 0.00–0.07)
Basophils Absolute: 0.1 10*3/uL (ref 0.0–0.1)
Basophils Relative: 1 %
Eosinophils Absolute: 0.5 10*3/uL (ref 0.0–0.5)
Eosinophils Relative: 4 %
HCT: 39 % (ref 36.0–46.0)
Hemoglobin: 12.6 g/dL (ref 12.0–15.0)
Immature Granulocytes: 0 %
Lymphocytes Relative: 37 %
Lymphs Abs: 4.3 10*3/uL — ABNORMAL HIGH (ref 0.7–4.0)
MCH: 28.5 pg (ref 26.0–34.0)
MCHC: 32.3 g/dL (ref 30.0–36.0)
MCV: 88.2 fL (ref 80.0–100.0)
Monocytes Absolute: 0.8 10*3/uL (ref 0.1–1.0)
Monocytes Relative: 7 %
Neutro Abs: 6 10*3/uL (ref 1.7–7.7)
Neutrophils Relative %: 51 %
Platelets: 436 10*3/uL — ABNORMAL HIGH (ref 150–400)
RBC: 4.42 MIL/uL (ref 3.87–5.11)
RDW: 13.3 % (ref 11.5–15.5)
WBC: 11.8 10*3/uL — ABNORMAL HIGH (ref 4.0–10.5)
nRBC: 0 % (ref 0.0–0.2)

## 2021-10-08 NOTE — ED Provider Notes (Signed)
?[facility] EMERGENCY DEPARTMENT ?Provider Note ? ? ?CSN: [id number] ?Arrival date & time: 10/08/21  0347 ? ?  ? ?History ? ?Chief Complaint  ?Patient presents with  ? Leg Pain  ? ? ?Shannon Faulkner is a 26 y.o. 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 She states that these symptoms started about 2 days ago and she is unsure what triggered this. Currently, she is not experiencing any numbness, but she states that her symptoms are intermittent. She denies any pain in her left leg, but states that it feels uncomfortable. The patient denies any dizziness, lightheadedness, fevers, chills, chest pain, SOB, abd pain, n/v/d, dysuria, or hematuria at this time. She does endorse occasionally have shortness of breath and lightheadedness, however, this is chronic and unchanged for the patient and she attributes this to her anemia. The patient states that she had a blood clot in her lung\ previously and she is concerned she may have a clot in her left leg. She had a surgery about 1 month ago and states that she was mostly bed bound for the week after.  ? ?The history is provided by the patient. No language interpreter was used.  ?Leg Pain ?Location:  Leg ?Leg location:  L leg ?Pain details:  ?  Quality:  Tingling ?  Radiates to:  Does not radiate ?  Severity:  No pain ?  Duration:  2 days ?Associated symptoms: no fever   ? ?  ? ?Home Medications ?Prior to Admission medications   ?Medication Sig Start Date End Date Taking? Authorizing Provider  ?acetaminophen (TYLENOL) 500 MG tablet Take 1,000 mg by mouth daily as needed for headache (pain).   Yes [provider]  ?buPROPion (WELLBUTRIN XL) 300 MG 24 hr tablet Take 1 tablet (300 mg total) by mouth daily. ?Patient taking differently: Take 300 mg by mouth daily as needed (seasonal depression). 05/27/20  Yes Money, Gerlene Burdock, FNP  ?DICLEGIS  10-10 MG TBEC Take 10 mg by mouth daily as needed (nausea). 09/08/21  Yes [provider]  ?lamoTRIgine (LAMICTAL) 25 MG tablet Take 25 mg by mouth daily. 09/01/21  Yes [provider]  ?OVER THE COUNTER MEDICATION Place 2 drops into both eyes daily as needed (dry eyes). Dry eye drops. Unknown brand or dose.   Yes [provider]  ?amphetamine-dextroamphetamine (ADDERALL) 5 MG tablet Take 1 tablet by mouth 2 (two) times daily. ?Patient not taking: Reported on 10/08/2021 05/15/20   [provider]  ?OXcarbazepine (TRILEPTAL) 150 MG tablet Take 1 tablet (150 mg total) by mouth 2 (two) times daily. ?Patient not taking: Reported on 10/08/2021 05/26/20   Money, Gerlene Burdock, FNP  ?   ? ?Allergies    ?Patient has no known allergies.   ? ?Review of Systems   ?Review of Systems  ?Constitutional:  Negative for chills and fever.  ?HENT: Negative.    ?Respiratory:  Negative for cough and shortness of breath.   ?Cardiovascular:  Negative for chest pain and leg swelling.  ?Gastrointestinal:  Negative for abdominal pain, diarrhea, nausea and vomiting.  ?Genitourinary:  Negative for dysuria and hematuria.  ?Skin: Negative.   ?Neurological:  Negative for dizziness and light-headedness.  ? ?Physical Exam ?Updated Vital Signs ?BP 108/72   Pulse 70   Temp 98 ?F (36.7 ?C)   Resp 12   Ht 5\' 2"  (1.575 m)   Wt  60.8 kg   SpO2 100%   BMI 24.51 kg/m?  ?Physical Exam ?Constitutional:   ?   General: She is not in acute distress. ?   Appearance: Normal appearance.  ?HENT:  ?   Head: Normocephalic and atraumatic.  ?Cardiovascular:  ?   Rate and Rhythm: Normal rate and regular rhythm.  ?   Pulses: Normal pulses.  ?   Heart sounds: No murmur heard. ?Pulmonary:  ?   Effort: No respiratory distress.  ?   Breath sounds: Normal breath sounds. No wheezing, rhonchi or rales.  ?Abdominal:  ?   General: Bowel sounds are normal. There is no distension.  ?   Palpations: Abdomen is soft.  ?   Tenderness: There is no  abdominal tenderness.  ?Musculoskeletal:     ?   General: No swelling or tenderness.  ?   Right lower leg: No edema.  ?   Left lower leg: No edema.  ?Skin: ?   General: Skin is warm and dry.  ?Neurological:  ?   General: No focal deficit present.  ?   Mental Status: She is alert and oriented to person, place, and time. Mental status is at baseline.  ?   Sensory: No sensory deficit.  ? ? ?ED Results / Procedures / Treatments   ?Labs ?(all labs ordered are listed, but only abnormal results are displayed) ?Labs Reviewed  ?CBC WITH DIFFERENTIAL/PLATELET - Abnormal; Notable for the following components:  ?    Result Value  ? WBC 11.8 (*)   ? Platelets 436 (*)   ? Lymphs Abs 4.3 (*)   ? All other components within normal limits  ?COMPREHENSIVE METABOLIC PANEL  ? ? ?EKG ?None ? ?Radiology ?VAS US LOWER EXTREMITY VENOUS (DVT) (ONLY [facility] & [facility]) ? ?Result Date: 10/08/2021 ? Lower Venous DVT Study Patient Name:  Shannon Faulkner  Date of Exam:   10/08/2021 Medical Rec #: [id number]           Accession #:    [id number] Date of Birth: Jun 15, 1995          Patient Gender: F Patient Age:   [age] years [facility] Procedure:      VAS US LOWER EXTREMITY VENOUS (DVT) Referring Phys: RILEY RANSOM --------------------------------------------------------------------------------  Indications: Swelling, and Edema.  Comparison Study: no prior Performing Technologist: [place] PonderMegan Stricklin RVS  Examination Guidelines: A complete evaluation includes B-mode imaging, spectral Doppler, color Doppler, and power Doppler as needed of all accessible portions of each vessel. Bilateral testing is considered an integral part of a complete examination. Limited examinations for reoccurring indications may be performed as noted. The reflux portion of the exam is performed with the patient in reverse Trendelenburg.  +-----+---------------+---------+-----------+----------+--------------+  RIGHTCompressibilityPhasicitySpontaneityPropertiesThrombus Aging +-----+---------------+---------+-----------+----------+--------------+ CFV  Full           Yes      Yes                                 +-----+---------------+---------+-----------+----------+--------------+   +---------+---------------+---------+-----------+----------+--------------+ LEFT     CompressibilityPhasicitySpontaneityPropertiesThrombus Aging +---------+---------------+---------+-----------+----------+--------------+ CFV      Full           Yes      Yes                                 +---------+---------------+---------+-----------+----------+--------------+ SFJ      Full                                                        +---------+---------------+---------+-----------+----------+--------------+  FV Prox  Full                                                        +---------+---------------+---------+-----------+----------+--------------+ FV Mid   Full                                                        +---------+---------------+---------+-----------+----------+--------------+ FV DistalFull                                                        +---------+---------------+---------+-----------+----------+--------------+ PFV      Full                                                        +---------+---------------+---------+-----------+----------+--------------+ POP      Full           Yes      Yes                                 +---------+---------------+---------+-----------+----------+--------------+ PTV      Full                                                        +---------+---------------+---------+-----------+----------+--------------+ PERO     Full                                                        +---------+---------------+---------+-----------+----------+--------------+     Summary: RIGHT: - No evidence of common femoral vein  obstruction.  LEFT: - There is no evidence of deep vein thrombosis in the lower extremity.  - No cystic structure found in the popliteal fossa.  *See table(s) above for measurements and observations.    Preliminary    ? ?Procedures ?Procedures  ? ? ?Medications Ordered in ED ?Medications - No data to display ? ?ED Course/ Medical Decision Making/ A&P ?Clinical

## 2021-10-08 NOTE — ED Provider Triage Note (Signed)
Emergency Medicine Provider Triage Evaluation Note ? ?Shannon Faulkner , a 26 y.o. female  was evaluated in triage.  Pt complains of pain and swelling to her left lower leg for the past 2 days.  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  Currently not on a blood thinner.  She reports that she also has intermittent decree sensation in her left lower leg as well. ? ?Review of Systems  ?Positive:  ?Negative:  ? ?Physical Exam  ?BP 118/79   Pulse 77   Temp 98.6 ?F (37 ?C) (Oral)   Resp 16   Ht 5\' 2"  (1.575 m)   Wt 60.8 kg   SpO2 100%   BMI 24.51 kg/m?  ?Gen:   Awake, no distress   ?Resp:  Normal effort  ?MSK:   Moves extremities without difficulty  ?Other:  Mild swelling noted to the left lower extremity.  Palpable DP, PT pulses bilaterally.  Sensation intact throughout and is equal.  Compartments are soft. ? ?Medical Decision Making  ?Medically screening exam initiated at 4:16 AM.  Appropriate orders placed.  was informed that the remainder of the evaluation will be completed by another provider, this initial triage assessment does not replace that evaluation, and the importance of remaining in the ED until their evaluation is complete. ? ?We will order basic labs and DVT study. ?  ?Shannon Castilla, PA-C ?10/08/21 12/08/21 ? ?

## 2021-10-08 NOTE — Discharge Instructions (Addendum)
Dear Ms. Verba, ? ?You were in the emergency room because of the numbness/tingling in your left leg and the concern that you may have had a blood clot. Your ultrasound of your leg looked normal and there was not any evidence of blood clots. Your blood work was also normal. I recommend you follow up with your primary care physician! You should return to the ER if you develop one sided leg swelling, pain in your calf with swelling, or redness to your leg.  ?

## 2021-10-08 NOTE — ED Triage Notes (Signed)
C/o pain in left leg onset 2 days ago . C/o numbness at times when walking. States factor 2  gene mutation . Denies chest pain or sob at present. History of blood clot in past  ?

## 2021-10-08 NOTE — Progress Notes (Signed)
Lower extremity venous duplex has been completed.  ? ?Preliminary results in CV Proc.  ? ?Angline Schweigert Deina Lipsey ?10/08/2021 9:29 AM    ?

## 2023-06-06 DIAGNOSIS — F909 Attention-deficit hyperactivity disorder, unspecified type: Secondary | ICD-10-CM | POA: Diagnosis not present

## 2023-06-06 DIAGNOSIS — F4010 Social phobia, unspecified: Secondary | ICD-10-CM | POA: Diagnosis not present

## 2023-06-06 DIAGNOSIS — F53 Postpartum depression: Secondary | ICD-10-CM | POA: Diagnosis not present

## 2023-06-06 DIAGNOSIS — F411 Generalized anxiety disorder: Secondary | ICD-10-CM | POA: Diagnosis not present

## 2023-06-08 DIAGNOSIS — M546 Pain in thoracic spine: Secondary | ICD-10-CM | POA: Diagnosis not present

## 2023-06-08 DIAGNOSIS — G8929 Other chronic pain: Secondary | ICD-10-CM | POA: Diagnosis not present

## 2023-06-08 DIAGNOSIS — F4010 Social phobia, unspecified: Secondary | ICD-10-CM | POA: Diagnosis not present

## 2023-06-08 DIAGNOSIS — Z0000 Encounter for general adult medical examination without abnormal findings: Secondary | ICD-10-CM | POA: Diagnosis not present

## 2023-06-08 DIAGNOSIS — F53 Postpartum depression: Secondary | ICD-10-CM | POA: Diagnosis not present

## 2023-06-08 DIAGNOSIS — D509 Iron deficiency anemia, unspecified: Secondary | ICD-10-CM | POA: Diagnosis not present

## 2023-06-08 DIAGNOSIS — M419 Scoliosis, unspecified: Secondary | ICD-10-CM | POA: Diagnosis not present

## 2023-06-08 DIAGNOSIS — F411 Generalized anxiety disorder: Secondary | ICD-10-CM | POA: Diagnosis not present

## 2023-06-08 DIAGNOSIS — M542 Cervicalgia: Secondary | ICD-10-CM | POA: Diagnosis not present

## 2023-06-08 DIAGNOSIS — F909 Attention-deficit hyperactivity disorder, unspecified type: Secondary | ICD-10-CM | POA: Diagnosis not present

## 2023-06-08 DIAGNOSIS — Z13220 Encounter for screening for lipoid disorders: Secondary | ICD-10-CM | POA: Diagnosis not present

## 2023-07-06 DIAGNOSIS — F53 Postpartum depression: Secondary | ICD-10-CM | POA: Diagnosis not present

## 2023-07-06 DIAGNOSIS — F909 Attention-deficit hyperactivity disorder, unspecified type: Secondary | ICD-10-CM | POA: Diagnosis not present

## 2023-07-06 DIAGNOSIS — F411 Generalized anxiety disorder: Secondary | ICD-10-CM | POA: Diagnosis not present

## 2023-07-06 DIAGNOSIS — F4010 Social phobia, unspecified: Secondary | ICD-10-CM | POA: Diagnosis not present

## 2023-08-01 DIAGNOSIS — F4010 Social phobia, unspecified: Secondary | ICD-10-CM | POA: Diagnosis not present

## 2023-08-01 DIAGNOSIS — F909 Attention-deficit hyperactivity disorder, unspecified type: Secondary | ICD-10-CM | POA: Diagnosis not present

## 2023-08-01 DIAGNOSIS — F53 Postpartum depression: Secondary | ICD-10-CM | POA: Diagnosis not present

## 2023-08-01 DIAGNOSIS — F411 Generalized anxiety disorder: Secondary | ICD-10-CM | POA: Diagnosis not present

## 2023-08-19 DIAGNOSIS — F4010 Social phobia, unspecified: Secondary | ICD-10-CM | POA: Diagnosis not present

## 2023-08-19 DIAGNOSIS — F53 Postpartum depression: Secondary | ICD-10-CM | POA: Diagnosis not present

## 2023-08-19 DIAGNOSIS — F411 Generalized anxiety disorder: Secondary | ICD-10-CM | POA: Diagnosis not present

## 2023-08-19 DIAGNOSIS — F909 Attention-deficit hyperactivity disorder, unspecified type: Secondary | ICD-10-CM | POA: Diagnosis not present

## 2023-09-26 DIAGNOSIS — F411 Generalized anxiety disorder: Secondary | ICD-10-CM | POA: Diagnosis not present

## 2023-09-26 DIAGNOSIS — F53 Postpartum depression: Secondary | ICD-10-CM | POA: Diagnosis not present

## 2023-09-26 DIAGNOSIS — F4010 Social phobia, unspecified: Secondary | ICD-10-CM | POA: Diagnosis not present

## 2023-09-26 DIAGNOSIS — F909 Attention-deficit hyperactivity disorder, unspecified type: Secondary | ICD-10-CM | POA: Diagnosis not present

## 2023-11-02 DIAGNOSIS — F4010 Social phobia, unspecified: Secondary | ICD-10-CM | POA: Diagnosis not present

## 2023-11-02 DIAGNOSIS — F909 Attention-deficit hyperactivity disorder, unspecified type: Secondary | ICD-10-CM | POA: Diagnosis not present

## 2023-11-02 DIAGNOSIS — F53 Postpartum depression: Secondary | ICD-10-CM | POA: Diagnosis not present

## 2023-11-02 DIAGNOSIS — F411 Generalized anxiety disorder: Secondary | ICD-10-CM | POA: Diagnosis not present

## 2023-11-09 DIAGNOSIS — R590 Localized enlarged lymph nodes: Secondary | ICD-10-CM | POA: Diagnosis not present

## 2023-11-09 DIAGNOSIS — K0889 Other specified disorders of teeth and supporting structures: Secondary | ICD-10-CM | POA: Diagnosis not present

## 2023-11-09 DIAGNOSIS — K029 Dental caries, unspecified: Secondary | ICD-10-CM | POA: Diagnosis not present

## 2023-11-10 DIAGNOSIS — R590 Localized enlarged lymph nodes: Secondary | ICD-10-CM | POA: Diagnosis not present

## 2023-11-10 DIAGNOSIS — K0889 Other specified disorders of teeth and supporting structures: Secondary | ICD-10-CM | POA: Diagnosis not present

## 2023-11-25 DIAGNOSIS — F411 Generalized anxiety disorder: Secondary | ICD-10-CM | POA: Diagnosis not present

## 2023-11-25 DIAGNOSIS — F909 Attention-deficit hyperactivity disorder, unspecified type: Secondary | ICD-10-CM | POA: Diagnosis not present

## 2023-11-25 DIAGNOSIS — F53 Postpartum depression: Secondary | ICD-10-CM | POA: Diagnosis not present

## 2023-11-25 DIAGNOSIS — F4010 Social phobia, unspecified: Secondary | ICD-10-CM | POA: Diagnosis not present

## 2024-04-04 ENCOUNTER — Other Ambulatory Visit: Payer: Self-pay | Admitting: Oncology

## 2024-04-04 DIAGNOSIS — D539 Nutritional anemia, unspecified: Secondary | ICD-10-CM

## 2024-04-04 NOTE — Progress Notes (Unsigned)
 [facility]  [address] [place],  [place]  [address] [phone]  Clinic Day:  07/31/2020  Referring physician: Arlyne Aid, NP  HISTORY OF PRESENT ILLNESS:  The patient is a 28 y.o. female  who I was asked to re-evaluate for anemia.  However, labs in January 2025 showed a normal hemoglobin of 13.8.  At that time, she had borderline low iron parameters, including a ferritin of 31, a low serum iron of 38, a TIBC of 346, and a low iron saturation of 11%.      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  This was discovered after having a DVT in the postpartum setting with her 1st pregnancy.  However, she did not have any clotting complications with her 2nd and 3rd pregnancies as she was placed on Lovenox.  She has been off Lovenox since December 2021; her 3rd child was born October 2021.  Overall, the patient has been doing well.  Se denies having leg swelling, pleuritic chest pain or other symptoms which concern her for either a DVT or pulmonary embolus having re-developed.    PHYSICAL EXAM:  unknown if currently breastfeeding. Wt Readings from Last 3 Encounters:  10/08/21 134 lb (60.8 kg)  07/31/20 148 lb (67.1 kg)  09/20/18 138 lb 6 oz (62.8 kg)   There is no height or weight on file to calculate BMI. Performance status (ECOG): 0 - Asymptomatic Physical Exam Constitutional:      Appearance: Normal appearance. She is not ill-appearing.  HENT:     Mouth/Throat:     Mouth: Mucous membranes are moist.     Pharynx: Oropharynx is clear. No oropharyngeal exudate or posterior oropharyngeal erythema.  Cardiovascular:     Rate and Rhythm: Normal rate and regular rhythm.     Heart sounds: No murmur heard.    No friction rub. No gallop.  Pulmonary:     Effort: Pulmonary effort is normal. No respiratory distress.     Breath sounds: Normal breath sounds. No wheezing, rhonchi or rales.  Abdominal:     General: Bowel sounds are normal.  There is no distension.     Palpations: Abdomen is soft. There is no mass.     Tenderness: There is no abdominal tenderness.  Musculoskeletal:        General: No swelling.     Right lower leg: No edema.     Left lower leg: No edema.  Lymphadenopathy:     Cervical: No cervical adenopathy.     Upper Body:     Right upper body: No supraclavicular or axillary adenopathy.     Left upper body: No supraclavicular or axillary adenopathy.     Lower Body: No right inguinal adenopathy. No left inguinal adenopathy.  Skin:    General: Skin is warm.     Coloration: Skin is not jaundiced.     Findings: No lesion or rash.  Neurological:     General: No focal deficit present.     Mental Status: She is alert and oriented to person, place, and time. Mental status is at baseline.  Psychiatric:        Mood and Affect: Mood normal.        Behavior: Behavior normal.        Thought Content: Thought content normal.     LABS:      Latest Ref Rng & Units 10/08/2021    4:53 AM 05/25/2020   10:49 PM  CBC  WBC 4.0 -  10.5 K/uL 11.8  9.5   Hemoglobin 12.0 - 15.0 g/dL 87.3  88.1   Hematocrit 36.0 - 46.0 % 39.0  39.6   Platelets 150 - 400 K/uL 436  373       Latest Ref Rng & Units 10/08/2021    4:53 AM 05/25/2020   10:49 PM  CMP  Glucose 70 - 99 mg/dL 89  79   BUN 6 - 20 mg/dL 9  7   Creatinine 9.55 - 1.00 mg/dL 9.25  9.26   Sodium 864 - 145 mmol/L 138  139   Potassium 3.5 - 5.1 mmol/L 3.8  3.4   Chloride 98 - 111 mmol/L 106  102   CO2 22 - 32 mmol/L 23  24   Calcium 8.9 - 10.3 mg/dL 9.2  9.4   Total Protein 6.5 - 8.1 g/dL 7.1  7.5   Total Bilirubin 0.3 - 1.2 mg/dL 0.7  0.4   Alkaline Phos 38 - 126 U/L 55  87   AST 15 - 41 U/L 24  17   ALT 0 - 44 U/L 8  17     ASSESSMENT & PLAN:  Assessment/Plan:  A 28 y.o. 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  As mentioned previously, the only time she has had a previous DVT was in the postpartum  period after the birth of her 1st child.  Her other 2 pregnancies went fine as she was on Lovenox throughout both of them.  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  However, I would recommend Lovenox if she chooses to become pregnant again, as it has been successful in preventing additional clots with her 2nd and 3rd pregnancies.  I would only consider lifelong anticoagulation if she was to develop another blood clot, particularly if it was unprovoked in nature.  As she has no other pressing hematologic issues, I will turn her care back over to her other physicians.  I have no problem seeing this patient in the future if new hematologic issues develop that require repeat clinical assessment. The patient understands all the plans discussed today and is in agreement with them.     Nahia Nissan DELENA Kerns, MD

## 2024-04-05 ENCOUNTER — Inpatient Hospital Stay

## 2024-04-05 ENCOUNTER — Inpatient Hospital Stay: Attending: Oncology | Admitting: Oncology

## 2024-04-05 VITALS — BP 138/111 | HR 82 | Temp 98.2°F | Resp 14 | Ht 62.0 in | Wt 154.8 lb

## 2024-04-05 DIAGNOSIS — D649 Anemia, unspecified: Secondary | ICD-10-CM | POA: Insufficient documentation

## 2024-04-05 DIAGNOSIS — D539 Nutritional anemia, unspecified: Secondary | ICD-10-CM

## 2024-04-05 LAB — CBC WITH DIFFERENTIAL (CANCER CENTER ONLY)
Abs Immature Granulocytes: 0.03 K/uL (ref 0.00–0.07)
Basophils Absolute: 0.1 K/uL (ref 0.0–0.1)
Basophils Relative: 1 %
Eosinophils Absolute: 0.4 K/uL (ref 0.0–0.5)
Eosinophils Relative: 5 %
HCT: 42.5 % (ref 36.0–46.0)
Hemoglobin: 14.2 g/dL (ref 12.0–15.0)
Immature Granulocytes: 0 %
Lymphocytes Relative: 30 %
Lymphs Abs: 2.6 K/uL (ref 0.7–4.0)
MCH: 28.4 pg (ref 26.0–34.0)
MCHC: 33.4 g/dL (ref 30.0–36.0)
MCV: 85 fL (ref 80.0–100.0)
Monocytes Absolute: 0.5 K/uL (ref 0.1–1.0)
Monocytes Relative: 6 %
Neutro Abs: 4.9 K/uL (ref 1.7–7.7)
Neutrophils Relative %: 58 %
Platelet Count: 361 K/uL (ref 150–400)
RBC: 5 MIL/uL (ref 3.87–5.11)
RDW: 13.2 % (ref 11.5–15.5)
WBC Count: 8.5 K/uL (ref 4.0–10.5)
nRBC: 0 % (ref 0.0–0.2)

## 2024-04-05 LAB — IRON AND TIBC
Iron: 57 ug/dL (ref 28–170)
Saturation Ratios: 17 % (ref 10.4–31.8)
TIBC: 343 ug/dL (ref 250–450)
UIBC: 286 ug/dL

## 2024-04-05 LAB — VITAMIN B12: Vitamin B-12: 254 pg/mL (ref 180–914)

## 2024-04-05 LAB — CMP (CANCER CENTER ONLY)
ALT: <5 U/L (ref 0–44)
AST: 16 U/L (ref 15–41)
Albumin: 4.8 g/dL (ref 3.5–5.0)
Alkaline Phosphatase: 82 U/L (ref 38–126)
Anion gap: 11 (ref 5–15)
BUN: 8 mg/dL (ref 6–20)
CO2: 27 mmol/L (ref 22–32)
Calcium: 9.7 mg/dL (ref 8.9–10.3)
Chloride: 102 mmol/L (ref 98–111)
Creatinine: 0.75 mg/dL (ref 0.44–1.00)
GFR, Estimated: >60 mL/min (ref >60)
Glucose, Bld: 84 mg/dL (ref 70–99)
Potassium: 3.7 mmol/L (ref 3.5–5.1)
Sodium: 140 mmol/L (ref 135–145)
Total Bilirubin: 0.4 mg/dL (ref 0.0–1.2)
Total Protein: 7.9 g/dL (ref 6.5–8.1)

## 2024-04-05 LAB — FOLATE: Folate: 7.4 ng/mL (ref >5.9)

## 2024-04-05 LAB — FERRITIN: Ferritin: 40 ng/mL (ref 11–307)

## 2024-04-07 LAB — SOLUBLE TRANSFERRIN RECEPTOR: Transferrin Receptor: 18 nmol/L (ref 12.2–27.3)
